# Patient Record
Sex: Male | Born: 1985 | Race: White | Hispanic: No | Marital: Married | Smoking: Current every day smoker
Health system: Southern US, Community
[De-identification: ages and names within clinical notes are randomized; demographics above are authoritative.]

---
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
## 2008-04-13 ENCOUNTER — Ambulatory Visit: Payer: Self-pay | Admitting: Family Medicine

## 2008-04-13 DIAGNOSIS — J029 Acute pharyngitis, unspecified: Secondary | ICD-10-CM | POA: Insufficient documentation

## 2008-04-16 ENCOUNTER — Telehealth (INDEPENDENT_AMBULATORY_CARE_PROVIDER_SITE_OTHER): Payer: Self-pay | Admitting: *Deleted

## 2011-11-30 ENCOUNTER — Encounter (HOSPITAL_COMMUNITY): Payer: Self-pay | Admitting: *Deleted

## 2011-11-30 ENCOUNTER — Emergency Department (HOSPITAL_COMMUNITY)
Admission: EM | Admit: 2011-11-30 | Discharge: 2011-12-01 | Disposition: A | Discharge status: Home / Self Care | Payer: Self-pay | Attending: Emergency Medicine | Admitting: Emergency Medicine

## 2011-11-30 DIAGNOSIS — Z880 Allergy status to penicillin: Secondary | ICD-10-CM | POA: Insufficient documentation

## 2011-11-30 DIAGNOSIS — W57XXXA Bitten or stung by nonvenomous insect and other nonvenomous arthropods, initial encounter: Secondary | ICD-10-CM

## 2011-11-30 DIAGNOSIS — S90569A Insect bite (nonvenomous), unspecified ankle, initial encounter: Secondary | ICD-10-CM | POA: Insufficient documentation

## 2011-11-30 DIAGNOSIS — Z23 Encounter for immunization: Secondary | ICD-10-CM | POA: Insufficient documentation

## 2011-11-30 DIAGNOSIS — Z91038 Other insect allergy status: Secondary | ICD-10-CM | POA: Insufficient documentation

## 2011-11-30 MED ORDER — IBUPROFEN 400 MG PO TABS
800.0000 mg | ORAL_TABLET | Freq: Once | ORAL | Status: AC
  Administered 2011-11-30: 800 mg via ORAL
  Filled 2011-11-30: qty 2

## 2011-11-30 MED ORDER — SODIUM CHLORIDE 0.9 % IV BOLUS (SEPSIS)
1000.0000 mL | Freq: Once | INTRAVENOUS | Status: AC
  Administered 2011-12-01: 1000 mL via INTRAVENOUS

## 2011-11-30 MED ORDER — HYDROCORTISONE 1 % EX OINT
TOPICAL_OINTMENT | Freq: Three times a day (TID) | CUTANEOUS | Status: DC
  Administered 2011-11-30: 23:00:00 via TOPICAL
  Filled 2011-11-30: qty 28.35

## 2011-11-30 MED ORDER — FAMOTIDINE 20 MG PO TABS
20.0000 mg | ORAL_TABLET | Freq: Once | ORAL | Status: AC
  Administered 2011-11-30: 20 mg via ORAL
  Filled 2011-11-30: qty 1

## 2011-11-30 NOTE — ED Provider Notes (Signed)
History     CSN: [id number]  Arrival date & time 11/30/11  2153   First MD Initiated Contact with Patient 11/30/11 2211      Chief Complaint  Patient presents with  . Insect Bite   HPI  History provided by the patient. Patient is a 26 year old male with past history of anaphylactic reaction to bee sting presents with concerns for insect bite and allergic reaction. Patient states he was working outside earlier today developed pinching and itching sensation to his left anterior thigh area. Patient was wearing pants and knee-high boots of the time. Patient states he is unsure what may have been him. Patient did have itching and developed a slight redness to the skin. This persisted and developed some burning and pain to the area later this evening. Patient was with family who are concerned that he may have been stung diabetes. Patient does note having some slight nausea and upset stomach. He denies any other associated symptoms. Patient denies having any difficulty breathing, swelling of the throat or rash over body.   History reviewed. No pertinent past medical history.  History reviewed. No pertinent past surgical history.  No family history on file.  History  Substance Use Topics  . Smoking status: Current Everyday Smoker  . Smokeless tobacco: Not on file  . Alcohol Use: Yes      Review of Systems  Constitutional: Negative for fever and chills.  Respiratory: Negative for shortness of breath and wheezing.   Cardiovascular: Negative for chest pain.  Gastrointestinal: Positive for nausea. Negative for vomiting and abdominal pain.  Skin: Negative for rash.    Allergies  Bee venom and Penicillins  Home Medications  No current outpatient prescriptions on file.  BP 158/85  Pulse 82  Temp 98.2 F (36.8 C) (Oral)  Resp 18  SpO2 99%  Physical Exam  Nursing note and vitals reviewed. Constitutional: He is oriented to person, place, and time. He appears well-developed and  well-nourished. No distress.  HENT:  Head: Normocephalic.  Cardiovascular: Normal rate and regular rhythm.   Pulmonary/Chest: Effort normal and breath sounds normal. No respiratory distress. He has no wheezes.  Neurological: He is alert and oriented to person, place, and time.  Skin: Skin is warm.       Macular area of erythema at the anterior distal left thigh. No puncture wounds or swelling present. No induration or erythematous streaks. Slight central hypopigmentation present.  Psychiatric: He has a normal mood and affect. His behavior is normal.    ED Course  Procedures      1. Insect bite       MDM  10:30 PM patient seen and evaluated. Patient in no acute distress. Patient with normal respirations and patent airway.   No concerning signs for anaphylactic reaction. Patient does seem to have some localized irritation and reaction. It is unclear if this was a bee sting or other insect. At this time we'll recommend symptomatic treatment.     Angus Seller, [place] 12/01/11 [phone]

## 2011-11-30 NOTE — ED Notes (Signed)
The pt  Was stung on his lt thigh by an unknown  Insect at 1500 today.  He is allergic to bees .  No sob  No rash some itching.  No acute distress

## 2011-12-01 LAB — POCT I-STAT, CHEM 8
BUN: 10 mg/dL (ref 6–23)
Calcium, Ion: 1.15 mmol/L (ref 1.12–1.23)
Chloride: 107 mEq/L (ref 96–112)
Glucose, Bld: 95 mg/dL (ref 70–99)
TCO2: 22 mmol/L (ref 0–100)

## 2011-12-01 MED ORDER — EPINEPHRINE 0.3 MG/0.3ML IJ DEVI: 0.3000 mg | INTRAMUSCULAR | Status: AC | PRN

## 2011-12-01 MED ORDER — TETANUS-DIPHTH-ACELL PERTUSSIS 5-2.5-18.5 LF-MCG/0.5 IM SUSP
0.5000 mL | Freq: Once | INTRAMUSCULAR | Status: AC
  Administered 2011-12-01: 0.5 mL via INTRAMUSCULAR
  Filled 2011-12-01: qty 0.5

## 2011-12-01 NOTE — ED Provider Notes (Signed)
Medical screening examination/treatment/procedure(s) were performed by non-physician practitioner and as supervising physician I was immediately available for consultation/collaboration.   Pariss Hommes L Jotham Ahn, MD 12/01/11 1525 

## 2020-05-08 ENCOUNTER — Other Ambulatory Visit: Payer: Self-pay

## 2020-05-08 ENCOUNTER — Emergency Department (HOSPITAL_COMMUNITY)
Admission: EM | Admit: 2020-05-08 | Discharge: 2020-05-08 | Disposition: A | Discharge status: Home / Self Care | Payer: Self-pay | Attending: Emergency Medicine | Admitting: Emergency Medicine

## 2020-05-08 ENCOUNTER — Emergency Department (HOSPITAL_COMMUNITY): Payer: Self-pay

## 2020-05-08 DIAGNOSIS — S93401A Sprain of unspecified ligament of right ankle, initial encounter: Secondary | ICD-10-CM | POA: Insufficient documentation

## 2020-05-08 DIAGNOSIS — Y92410 Unspecified street and highway as the place of occurrence of the external cause: Secondary | ICD-10-CM | POA: Insufficient documentation

## 2020-05-08 DIAGNOSIS — S060X1A Concussion with loss of consciousness of 30 minutes or less, initial encounter: Secondary | ICD-10-CM | POA: Insufficient documentation

## 2020-05-08 DIAGNOSIS — F17200 Nicotine dependence, unspecified, uncomplicated: Secondary | ICD-10-CM | POA: Insufficient documentation

## 2020-05-08 MED ORDER — ACETAMINOPHEN 500 MG PO TABS
1000.0000 mg | ORAL_TABLET | Freq: Once | ORAL | Status: AC
  Administered 2020-05-08: 1000 mg via ORAL
  Filled 2020-05-08: qty 2

## 2020-05-08 NOTE — ED Notes (Signed)
Ortho tech called 
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
## 2020-05-08 NOTE — Progress Notes (Signed)
Orthopedic Tech Progress Note Patient Details:  Jordan Fields 1985/08/14 [id number]  Ortho Devices Type of Ortho Device: Ankle Air splint,Crutches Ortho Device/Splint Location: rle Ortho Device/Splint Interventions: Ordered,Application,Adjustment   Post Interventions Patient Tolerated: Well Instructions Provided: Care of device,Adjustment of device   Trinna Post 05/08/2020, 8:14 PM
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
## 2020-05-08 NOTE — ED Notes (Signed)
Ortho tech placed air cast, crutch teaching provided by ortho tech.   Discharged with wife  WC to [facility]

## 2020-05-08 NOTE — ED Notes (Signed)
Wounds cleaned
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
## 2023-09-27 DEATH — deceased
